# Patient Record
Sex: Female | Born: 1980 | Race: Black or African American | Hispanic: No | Marital: Married | Smoking: Never smoker
Health system: Southern US, Community
[De-identification: ages and names within clinical notes are randomized; demographics above are authoritative.]

## PROBLEM LIST (undated history)

## (undated) DIAGNOSIS — J302 Other seasonal allergic rhinitis: Secondary | ICD-10-CM

## (undated) DIAGNOSIS — F419 Anxiety disorder, unspecified: Secondary | ICD-10-CM

## (undated) HISTORY — PX: LAPAROSCOPIC GASTRIC BAND REMOVAL WITH LAPAROSCOPIC GASTRIC SLEEVE RESECTION: SHX6498

---

## 2007-09-12 ENCOUNTER — Ambulatory Visit: Payer: Self-pay | Admitting: Internal Medicine

## 2011-09-04 ENCOUNTER — Ambulatory Visit: Payer: Self-pay | Admitting: Specialist

## 2011-09-04 LAB — MAGNESIUM: Magnesium: 1.6 mg/dL — ABNORMAL LOW

## 2011-09-04 LAB — COMPREHENSIVE METABOLIC PANEL
Albumin: 3.3 g/dL — ABNORMAL LOW (ref 3.4–5.0)
Alkaline Phosphatase: 76 U/L (ref 50–136)
BUN: 9 mg/dL (ref 7–18)
Bilirubin,Total: 0.5 mg/dL (ref 0.2–1.0)
Chloride: 109 mmol/L — ABNORMAL HIGH (ref 98–107)
Co2: 23 mmol/L (ref 21–32)
Creatinine: 0.79 mg/dL (ref 0.60–1.30)
EGFR (African American): >60
EGFR (Non-African Amer.): >60
Glucose: 111 mg/dL — ABNORMAL HIGH (ref 65–99)
Osmolality: 279 (ref 275–301)
SGPT (ALT): 20 U/L (ref 12–78)
Sodium: 140 mmol/L (ref 136–145)
Total Protein: 7.6 g/dL (ref 6.4–8.2)

## 2011-09-04 LAB — HEMOGLOBIN A1C: Hemoglobin A1C: 6.8 % — ABNORMAL HIGH (ref 4.2–6.3)

## 2011-09-04 LAB — CBC WITH DIFFERENTIAL/PLATELET
Basophil %: 0.8 %
Eosinophil %: 1.3 %
HCT: 40.8 % (ref 35.0–47.0)
Lymphocyte #: 1.7 10*3/uL (ref 1.0–3.6)
Lymphocyte %: 27.7 %
MCH: 31.8 pg (ref 26.0–34.0)
Monocyte #: 0.5 x10 3/mm (ref 0.2–0.9)
Neutrophil #: 3.9 10*3/uL (ref 1.4–6.5)
Platelet: 400 10*3/uL (ref 150–440)
RDW: 13.6 % (ref 11.5–14.5)

## 2011-09-04 LAB — APTT: Activated PTT: 27.3 secs (ref 23.6–35.9)

## 2011-09-04 LAB — HCG, QUANTITATIVE, PREGNANCY: Beta Hcg, Quant.: <1 m[IU]/mL — ABNORMAL LOW

## 2011-09-04 LAB — IRON AND TIBC
Iron Bind.Cap.(Total): 346 ug/dL (ref 250–450)
Iron: 86 ug/dL (ref 50–170)
Unbound Iron-Bind.Cap.: 260 ug/dL

## 2011-09-04 LAB — FOLATE: Folic Acid: 13.9 ng/mL (ref 3.1–100.0)

## 2011-09-04 LAB — HEPATIC FUNCTION PANEL A (ARMC): Bilirubin, Direct: <0.1 mg/dL (ref 0.00–0.20)

## 2011-09-04 LAB — AMYLASE: Amylase: 46 U/L (ref 25–115)

## 2011-09-04 LAB — FERRITIN: Ferritin (ARMC): 93 ng/mL (ref 8–388)

## 2011-09-04 LAB — LIPASE, BLOOD: Lipase: 108 U/L (ref 73–393)

## 2011-09-04 LAB — PHOSPHORUS: Phosphorus: 1.5 mg/dL — ABNORMAL LOW (ref 2.5–4.9)

## 2011-09-04 LAB — CALCIUM: Calcium, Total: 8.4 mg/dL — ABNORMAL LOW (ref 8.5–10.1)

## 2011-09-04 LAB — TSH: Thyroid Stimulating Horm: 1.82 u[IU]/mL

## 2011-09-04 LAB — PROTIME-INR: Prothrombin Time: 12.5 secs (ref 11.5–14.7)

## 2011-09-06 ENCOUNTER — Ambulatory Visit: Payer: Self-pay | Admitting: Specialist

## 2011-09-07 ENCOUNTER — Ambulatory Visit: Payer: Self-pay | Admitting: Specialist

## 2011-09-07 DIAGNOSIS — Z01810 Encounter for preprocedural cardiovascular examination: Secondary | ICD-10-CM

## 2011-09-23 ENCOUNTER — Ambulatory Visit: Payer: Self-pay | Admitting: Specialist

## 2011-12-11 ENCOUNTER — Ambulatory Visit: Payer: Self-pay | Admitting: Specialist

## 2011-12-11 LAB — MAGNESIUM: Magnesium: 1.6 mg/dL — ABNORMAL LOW

## 2011-12-11 LAB — PHOSPHORUS: Phosphorus: 2.1 mg/dL — ABNORMAL LOW (ref 2.5–4.9)

## 2011-12-17 ENCOUNTER — Inpatient Hospital Stay: Payer: Self-pay | Admitting: Specialist

## 2011-12-18 LAB — BASIC METABOLIC PANEL
BUN: 3 mg/dL — ABNORMAL LOW (ref 7–18)
Calcium, Total: 8.2 mg/dL — ABNORMAL LOW (ref 8.5–10.1)
Chloride: 106 mmol/L (ref 98–107)
Co2: 19 mmol/L — ABNORMAL LOW (ref 21–32)
EGFR (African American): >60
EGFR (Non-African Amer.): >60
Potassium: 4.1 mmol/L (ref 3.5–5.1)
Sodium: 135 mmol/L — ABNORMAL LOW (ref 136–145)

## 2011-12-18 LAB — CBC WITH DIFFERENTIAL/PLATELET
Basophil #: 0.1 10*3/uL (ref 0.0–0.1)
Eosinophil #: 0 10*3/uL (ref 0.0–0.7)
HCT: 40.2 % (ref 35.0–47.0)
HGB: 13.7 g/dL (ref 12.0–16.0)
Lymphocyte #: 0.8 10*3/uL — ABNORMAL LOW (ref 1.0–3.6)
Lymphocyte %: 6 %
MCHC: 34 g/dL (ref 32.0–36.0)
Monocyte %: 3.1 %
Neutrophil #: 11.4 10*3/uL — ABNORMAL HIGH (ref 1.4–6.5)
Platelet: 435 10*3/uL (ref 150–440)
RDW: 13.6 % (ref 11.5–14.5)
WBC: 12.6 10*3/uL — ABNORMAL HIGH (ref 3.6–11.0)

## 2011-12-18 LAB — ALBUMIN: Albumin: 3.3 g/dL — ABNORMAL LOW (ref 3.4–5.0)

## 2011-12-18 LAB — MAGNESIUM: Magnesium: 1.6 mg/dL — ABNORMAL LOW

## 2011-12-19 LAB — PATHOLOGY REPORT

## 2012-01-11 ENCOUNTER — Ambulatory Visit: Payer: Self-pay | Admitting: Specialist

## 2012-01-23 ENCOUNTER — Ambulatory Visit: Payer: Self-pay | Admitting: Specialist

## 2014-05-11 NOTE — Op Note (Signed)
PATIENT NAME:  Anna Carey, [name] MR#:  [id number] DATE OF BIRTH:  [birth year]  DATE OF PROCEDURE:  12/17/2011  PREOPERATIVE DIAGNOSIS: Morbid obesity.  POSTOPERATIVE DIAGNOSIS:  1. Morbid obesity. 2. Hiatal hernia.    PROCEDURE PERFORMED: Laparoscopic sleeve gastrectomy with laparoscopic hiatal hernia repair.   SURGEON: Primus BravoJon Bruce, MD  ASSISTANT: Mariella SaaSarah Stout, PA  ANESTHESIA: General endotracheal.   CLINICAL HISTORY: See History and Physical.  COMPLICATIONS: None.   ESTIMATED BLOOD LOSS: None.   FINDINGS: Moderate hiatal hernia.   PREOPERATIVE DIAGNOSIS: Morbid obesity.  POSTOPERATIVE DIAGNOSIS: Morbid obesity.  PROCEDURE:  Sleeve gastrectomy.  SURGEON: Primus BravoJon Bruce, MD  ASSISTANT:  Tobe SosWill Hawkins, PA  ANESTHESIA:  General endotracheal. INDICATION:  See History and Physical.   COMPLICATIONS: None.  ESTIMATED BLOOD LOSS: None.  DETAILS OF PROCEDURE:  The patient was taken to the operating room and placed on the operating room table, in the supine position, with appropriate monitors and supplemental oxygen being delivered.  Broad spectrum IV antibiotics were administered. The patient was placed under general anesthesia without incident.  The abdomen was prepped and draped in the usual sterile fashion.  Access was obtained using 5 mm Optical trocar. Pneumoperitoneum was established without difficulty. Multiple other ports were placed in preparation for sleeve gastrectomy. A liver retractor was placed without incident. The entire stomach was mobilized from 5 cm from the pylorus all the way up to the fundus, and the fundus was mobilized off the left crura as well completely freeing up the posterior portion of the stomach. Posterior attachments were taken down so the crura could be visualized from both sides.  At that point, everything was removed from the stomach and a 234 [place] Bougie was placed down into the antrum. An Echelon green load stapler was used to bisect the antrum on first  fire starting approximately 5 to 6 cm from the pylorus. I then continued up along the Bougie using a blue load stapler with excellent affect with care not to get too close to the Bougie itself, with minimal traction. This continued all the way up to the left crura. The excess stomach was placed on the side and the Bougie was removed and endoscopy showed no evidence of obstruction at that time.  The excess stomach was removed through the abdominal cavity, and the wounds were closed using 4-0 Vicryl and Dermabond.   Addendum: The hiatal hernia was closed in posterior fashion with mobilization of the posterior crura away from the esophagus and posterior vagus nerve while 1.5 cm of repair were performed posteriorly with interrupted 0 Surgidac sutures.   ____________________________ Primus BravoJon Bruce, MD jb:cbb D: 12/17/2011 15:53:42 ET T: 12/18/2011 09:35:31 ET JOB#: [id number]  cc: Primus BravoJon Bruce, MD, <Dictator> Geoffry ParadiseJON M BRUCE MD ELECTRONICALLY SIGNED 12/18/2011 10:57

## 2014-07-14 ENCOUNTER — Encounter: Payer: Self-pay | Admitting: Podiatry

## 2014-07-14 ENCOUNTER — Ambulatory Visit (INDEPENDENT_AMBULATORY_CARE_PROVIDER_SITE_OTHER): Payer: 59 | Admitting: Podiatry

## 2014-07-14 ENCOUNTER — Ambulatory Visit (INDEPENDENT_AMBULATORY_CARE_PROVIDER_SITE_OTHER): Payer: 59

## 2014-07-14 VITALS — BP 117/81 | HR 81 | Resp 16 | Ht 63.0 in | Wt 274.0 lb

## 2014-07-14 DIAGNOSIS — Q6650 Congenital pes planus, unspecified foot: Secondary | ICD-10-CM

## 2014-07-14 DIAGNOSIS — M722 Plantar fascial fibromatosis: Secondary | ICD-10-CM | POA: Diagnosis not present

## 2014-07-14 DIAGNOSIS — M79672 Pain in left foot: Secondary | ICD-10-CM | POA: Diagnosis not present

## 2014-07-14 NOTE — Progress Notes (Signed)
   Subjective:    Patient ID: Dayna Ramus, female    DOB: 04-Jan-1981, 34 y.o.   MRN: [id number] Pt has had foot orthotics in the pasted and wore them out really fast and would like a new pair . She has back, knee, and hip trouble and she sees a difference when she wears the insoles.  HPI    Review of Systems  Musculoskeletal: Positive for gait problem.       Joint pain  Back pain  muscle pain   Allergic/Immunologic: Positive for environmental allergies and food allergies.       Objective:   Physical Exam: I have reviewed her past mental history medications allergies surgery social history and review of systems. Pulses are strongly palpable bilateral. Neurologic sensorium is intact per Semmes-Weinstein monofilament. Deep tendon reflexes are intact bilateral and muscle strength +5 over 5 dorsiflexion and plantar flexors and inverters everters all intrinsic musculature is intact. Orthopedic evaluation was resolved joints distal to the ankle for range of motion without crepitation. She has severe has planus bilateral with pain on palpation medial calcaneal tubercles bilaterally. She states that she controls the majority of this with shoe gear. She is trying to lose weight and is trying to be more active.        Assessment & Plan:  Assessment: Plantar fasciitis and pes planus bilateral.  Plan: I encouraged her to continue her exercise regimen discussed appropriate shoe gear stretching exercises ice therapy and shoe gear modifications. Also suggested a new pair orthotics which were scanned for today and I will follow-up with her once those come in.

## 2014-07-19 ENCOUNTER — Telehealth: Payer: Self-pay

## 2014-07-19 NOTE — Telephone Encounter (Signed)
Called pt let her know that her orthotics were in and she needs to call to make an appt ( nurse schedule [facility]) to come in and pick up.

## 2014-07-27 ENCOUNTER — Other Ambulatory Visit: Payer: 59

## 2014-07-28 ENCOUNTER — Ambulatory Visit (INDEPENDENT_AMBULATORY_CARE_PROVIDER_SITE_OTHER): Payer: 59 | Admitting: *Deleted

## 2014-07-28 DIAGNOSIS — M722 Plantar fascial fibromatosis: Secondary | ICD-10-CM

## 2014-07-28 NOTE — Patient Instructions (Signed)

## 2014-07-28 NOTE — Progress Notes (Signed)
Orthotics dispensed.Given instructions on breakin. Recheck in 1 month, if needed.

## 2014-08-30 ENCOUNTER — Ambulatory Visit: Payer: 59 | Admitting: Podiatry

## 2019-05-17 ENCOUNTER — Encounter: Payer: Self-pay | Admitting: Emergency Medicine

## 2019-05-17 ENCOUNTER — Ambulatory Visit
Admission: EM | Admit: 2019-05-17 | Discharge: 2019-05-17 | Disposition: A | Discharge status: Home / Self Care | Payer: BC Managed Care – PPO | Attending: Family Medicine | Admitting: Family Medicine

## 2019-05-17 ENCOUNTER — Other Ambulatory Visit: Payer: Self-pay

## 2019-05-17 DIAGNOSIS — Z823 Family history of stroke: Secondary | ICD-10-CM | POA: Diagnosis not present

## 2019-05-17 DIAGNOSIS — J069 Acute upper respiratory infection, unspecified: Secondary | ICD-10-CM | POA: Diagnosis not present

## 2019-05-17 DIAGNOSIS — K529 Noninfective gastroenteritis and colitis, unspecified: Secondary | ICD-10-CM | POA: Diagnosis not present

## 2019-05-17 DIAGNOSIS — Z79899 Other long term (current) drug therapy: Secondary | ICD-10-CM | POA: Insufficient documentation

## 2019-05-17 DIAGNOSIS — F419 Anxiety disorder, unspecified: Secondary | ICD-10-CM | POA: Insufficient documentation

## 2019-05-17 DIAGNOSIS — U071 COVID-19: Secondary | ICD-10-CM | POA: Insufficient documentation

## 2019-05-17 DIAGNOSIS — R1110 Vomiting, unspecified: Secondary | ICD-10-CM | POA: Diagnosis present

## 2019-05-17 HISTORY — DX: Anxiety disorder, unspecified: F41.9

## 2019-05-17 HISTORY — DX: Other seasonal allergic rhinitis: J30.2

## 2019-05-17 MED ORDER — ONDANSETRON 8 MG PO TBDP: 8.0000 mg | ORAL_TABLET | Freq: Three times a day (TID) | ORAL | 0 refills | Status: AC | PRN

## 2019-05-17 NOTE — ED Provider Notes (Signed)
[facility]    CSN: [id number] Arrival date & time: 05/17/19  1350      History   Chief Complaint Chief Complaint  Patient presents with  . Emesis    HPI Anna Carey is a 39 y.o. female.   38 yo female with a c/o cold symptoms of nasal congestion, runny nose, mild cough for the past week and intermittent vomiting and diarrhea for the past 3 days. Denies any chest pains, shortness of breath, wheezing, chills, dysuria, hematuria, abdominal pain. 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    Emesis   Past Medical History:  Diagnosis Date  . Anxiety   . Seasonal allergies     There are no problems to display for this patient.   Past Surgical History:  Procedure Laterality Date  . LAPAROSCOPIC GASTRIC BAND REMOVAL WITH LAPAROSCOPIC GASTRIC SLEEVE RESECTION      OB History   No obstetric history on file.      Home Medications    Prior to Admission medications   Medication Sig Start Date End Date Taking? Authorizing Provider  cetirizine (ZYRTEC) 10 MG tablet Take by mouth. 09/09/07  Yes [provider]  fluticasone (FLONASE) 50 MCG/ACT nasal spray 1 spray by Each Nare route once daily. 06/05/12  Yes [provider]  norethindrone-ethinyl estradiol (ORTHO-NOVUM 7/7/7, 28,) 0.5/0.75/1-35 MG-MCG tablet TAKE 1 TABLET BY MOUTH EVERY DAY CONTINUOUSLY 06/18/14  Yes [provider]  sertraline (ZOLOFT) 100 MG tablet  04/19/14  Yes [provider]  ondansetron (ZOFRAN ODT) 8 MG disintegrating tablet Take 1 tablet (8 mg total) by mouth every 8 (eight) hours as needed. 05/17/19   Payton Mccallum, MD    Family History Family History  Problem Relation Age of Onset  . Stroke Mother   . Healthy Father     Social History Social History   Tobacco Use  . Smoking status: Never Smoker  . Smokeless tobacco: Never Used  Substance Use Topics  . Alcohol use: Not Currently    Alcohol/week: 0.0 standard drinks  . Drug use: Never      Allergies   Patient has no known allergies.   Review of Systems Review of Systems  Gastrointestinal: Positive for vomiting.     Physical Exam Triage Vital Signs ED Triage Vitals  Enc Vitals Group     BP 05/17/19 1406 113/84     Pulse Rate 05/17/19 1406 (!) 106     Resp 05/17/19 1406 16     Temp 05/17/19 1406 99.2 F (37.3 C)     Temp Source 05/17/19 1406 Oral     SpO2 05/17/19 1406 100 %     Weight 05/17/19 1402 280 lb (127 kg)     Height 05/17/19 1402 5\' 3"  (1.6 m)     Head Circumference --      Peak Flow --      Pain Score 05/17/19 1402 3     Pain Loc --      Pain Edu? --      Excl. in GC? --    No data found.  Updated Vital Signs BP 113/84 (BP Location: Right Arm)   Pulse (!) 106   Temp 99.2 F (37.3 C) (Oral)   Resp 16   Ht 5\' 3"  (1.6 m)   Wt 127 kg   LMP 05/03/2019 (Approximate)   SpO2 100%   BMI 49.60 kg/m   Visual Acuity Right Eye Distance:   Left Eye Distance:   Bilateral Distance:  Right Eye Near:   Left Eye Near:    Bilateral Near:     Physical Exam Vitals and nursing note reviewed.  Constitutional:      General: She is not in acute distress.    Appearance: She is not toxic-appearing or diaphoretic.  HENT:     Right Ear: Tympanic membrane normal.     Left Ear: Tympanic membrane normal.     Nose: Congestion present.  Cardiovascular:     Heart sounds: Normal heart sounds.  Pulmonary:     Effort: Pulmonary effort is normal. No respiratory distress.     Breath sounds: Normal breath sounds.  Abdominal:     General: Bowel sounds are normal. There is no distension.     Palpations: Abdomen is soft. There is no mass.     Tenderness: There is no abdominal tenderness. There is no right CVA tenderness, left CVA tenderness, guarding or rebound.     Hernia: No hernia is present.  Skin:    Findings: No rash.  Neurological:     Mental Status: She is alert.      UC Treatments / Results  Labs (all labs ordered are listed, but only  abnormal results are displayed) Labs Reviewed  SARS CORONAVIRUS 2 (TAT 6-24 HRS)    EKG   Radiology No results found.  Procedures Procedures (including critical care time)  Medications Ordered in UC Medications - No data to display  Initial Impression / Assessment and Plan / UC Course  I have reviewed the triage vital signs and the nursing notes.  Pertinent labs & imaging results that were available during my care of the patient were reviewed by me and considered in my medical decision making (see chart for details).      Final Clinical Impressions(s) / UC Diagnoses   Final diagnoses:  Gastroenteritis  Viral URI     Discharge Instructions     Rest, fluids, await covid test Go to Emergency Department if symptoms get worse or are not improving    ED Prescriptions    Medication Sig Dispense Auth. Provider   ondansetron (ZOFRAN ODT) 8 MG disintegrating tablet Take 1 tablet (8 mg total) by mouth every 8 (eight) hours as needed. 6 tablet Norval Gable, MD      1. diagnosis reviewed with patient 2. rx as per orders above; reviewed possible side effects, interactions, risks and benefits  3. Recommend supportive treatment with rest, fluids 4. covid test done 5. Follow-up prn if symptoms worsen or don't improve  PDMP not reviewed this encounter.   Norval Gable, MD 05/17/19 [phone]

## 2019-05-17 NOTE — Discharge Instructions (Signed)
Rest, fluids, await covid test Go to Emergency Department if symptoms get worse or are not improving

## 2019-05-17 NOTE — ED Triage Notes (Signed)
Patient states that she has had cold symptoms a week ago.  Patient states that these symptoms started to resolve.  Patient states that last Friday she had upset stomach.  Patient states that today she vomited and then felt light headed.  Patient reports low grad fevers 3 days ago.

## 2019-05-18 ENCOUNTER — Telehealth: Payer: Self-pay | Admitting: *Deleted

## 2019-05-18 LAB — SARS CORONAVIRUS 2 (TAT 6-24 HRS): SARS Coronavirus 2: POSITIVE — AB

## 2019-05-18 NOTE — Telephone Encounter (Signed)
Patient is concerned that no one has called her regarding her COVID result- she saw it in MyChart-. Explained to patient that the provider may not have signed off on the  Result yet for the staff to contact her. Results for COVID are released without a hold for review so patienst can be aware as soon as possible. Advised:  Patient notified of + COVID result. Patient test due to symptoms: GI symptoms, nausea. Patient advised to treat symptoms as needed OTC, contact PCP for follow up and go to ED for trouble breathing ,dehydration or severe weakness. Advised to isolate and safe precautions in the home reviewed. CDC criteria for ending isolation given. Health dept notified.

## 2019-05-19 ENCOUNTER — Telehealth: Payer: Self-pay | Admitting: Unknown Physician Specialty

## 2019-05-19 NOTE — Telephone Encounter (Signed)
Called to discuss with Anna Carey about Covid symptoms and the use of bamlanivimab, a monoclonal antibody infusion for those with mild to moderate Covid symptoms and at a high risk of hospitalization.     Pt does not qualify for infusion therapy as her symptoms first presented > 10 days prior to timing of infusion. Symptoms tier reviewed as well as criteria for ending isolation. Preventative practices reviewed. Patient verbalized understanding   There are no problems to display for this patient.

## 2021-04-05 ENCOUNTER — Other Ambulatory Visit: Payer: Self-pay | Admitting: Internal Medicine

## 2021-04-05 DIAGNOSIS — Z1231 Encounter for screening mammogram for malignant neoplasm of breast: Secondary | ICD-10-CM

## 2021-05-18 ENCOUNTER — Ambulatory Visit
Admission: RE | Admit: 2021-05-18 | Discharge: 2021-05-18 | Disposition: A | Discharge status: Home / Self Care | Payer: BC Managed Care – PPO | Source: Ambulatory Visit | Attending: Internal Medicine | Admitting: Internal Medicine

## 2021-05-18 DIAGNOSIS — Z1231 Encounter for screening mammogram for malignant neoplasm of breast: Secondary | ICD-10-CM | POA: Insufficient documentation

## 2022-07-11 ENCOUNTER — Other Ambulatory Visit: Payer: Self-pay | Admitting: Internal Medicine

## 2022-07-11 DIAGNOSIS — Z1231 Encounter for screening mammogram for malignant neoplasm of breast: Secondary | ICD-10-CM

## 2022-08-06 ENCOUNTER — Ambulatory Visit
Admission: RE | Admit: 2022-08-06 | Discharge: 2022-08-06 | Disposition: A | Discharge status: Home / Self Care | Payer: BC Managed Care – PPO | Source: Ambulatory Visit | Attending: Internal Medicine | Admitting: Internal Medicine

## 2022-08-06 DIAGNOSIS — Z1231 Encounter for screening mammogram for malignant neoplasm of breast: Secondary | ICD-10-CM | POA: Insufficient documentation

## 2022-08-08 ENCOUNTER — Encounter: Payer: Self-pay | Admitting: Internal Medicine

## 2022-08-08 DIAGNOSIS — Z1231 Encounter for screening mammogram for malignant neoplasm of breast: Secondary | ICD-10-CM

## 2022-08-09 ENCOUNTER — Other Ambulatory Visit: Payer: Self-pay | Admitting: Internal Medicine

## 2022-08-09 DIAGNOSIS — N6489 Other specified disorders of breast: Secondary | ICD-10-CM

## 2022-08-09 DIAGNOSIS — R928 Other abnormal and inconclusive findings on diagnostic imaging of breast: Secondary | ICD-10-CM

## 2022-08-15 ENCOUNTER — Ambulatory Visit
Admission: RE | Admit: 2022-08-15 | Discharge: 2022-08-15 | Disposition: A | Discharge status: Home / Self Care | Payer: BC Managed Care – PPO | Source: Ambulatory Visit | Attending: Internal Medicine | Admitting: Internal Medicine

## 2022-08-15 ENCOUNTER — Ambulatory Visit: Admission: RE | Admit: 2022-08-15 | Payer: BC Managed Care – PPO | Source: Ambulatory Visit

## 2022-08-15 DIAGNOSIS — N6489 Other specified disorders of breast: Secondary | ICD-10-CM

## 2022-08-15 DIAGNOSIS — R928 Other abnormal and inconclusive findings on diagnostic imaging of breast: Secondary | ICD-10-CM | POA: Diagnosis present

## 2022-08-18 IMAGING — MG MM DIGITAL SCREENING BILAT W/ TOMO AND CAD
8 series · 8 of 24 positions shown · non-contrast
Comparison: None.

CLINICAL DATA: Screening. This is the patient's initial baseline
mammogram.

EXAM:
DIGITAL SCREENING BILATERAL MAMMOGRAM WITH TOMOSYNTHESIS AND CAD
TECHNIQUE: Bilateral screening digital craniocaudal and mediolateral oblique
mammograms were obtained. Bilateral screening digital breast
tomosynthesis was performed. The images were evaluated with
computer-aided detection.

[R MLO synth-2D]
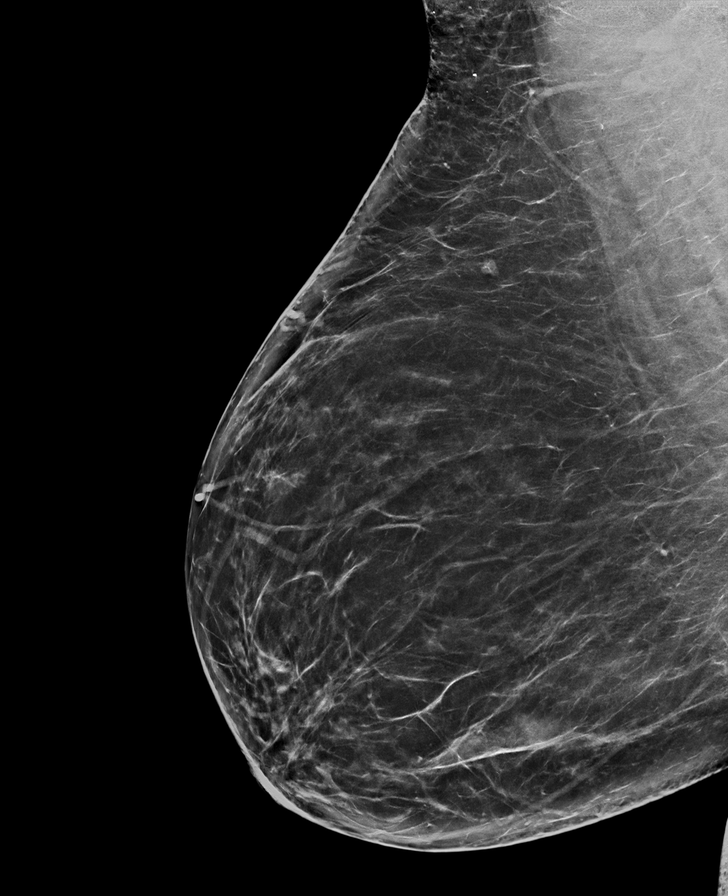

[R CC synth-2D]
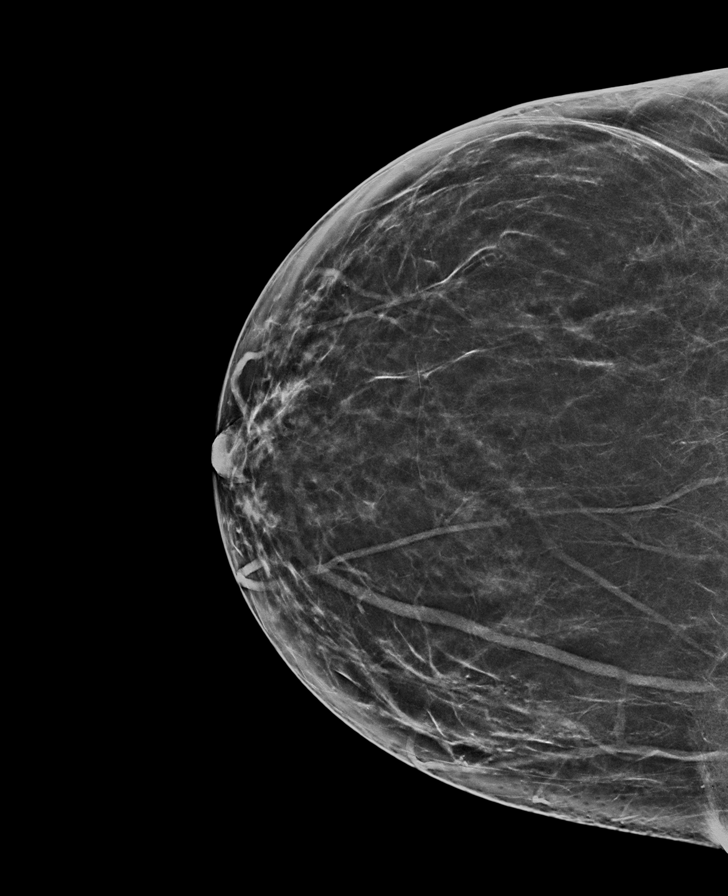

[L MLO synth-2D]
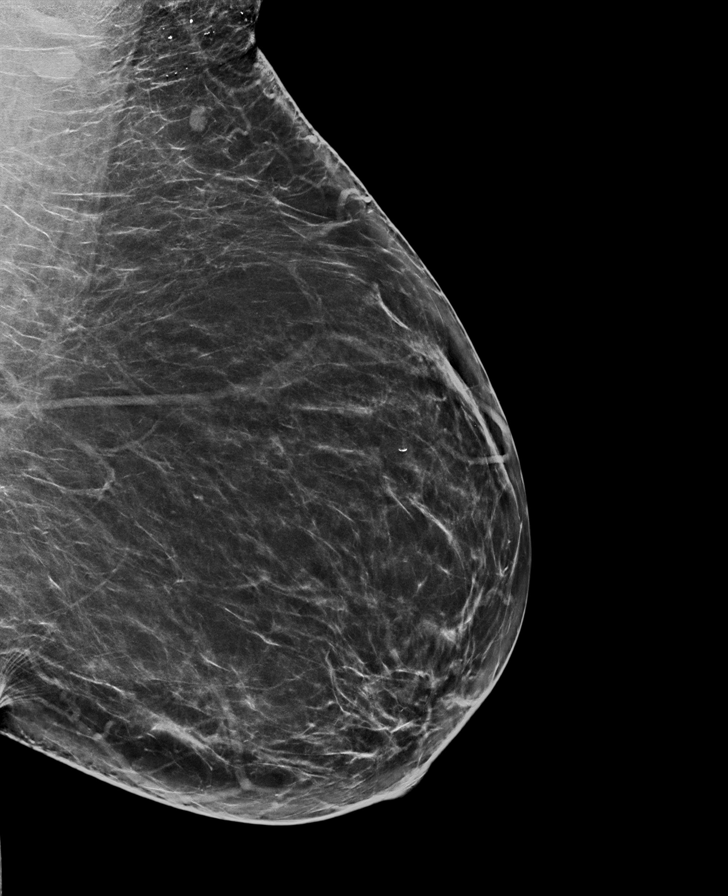

[L CC synth-2D]
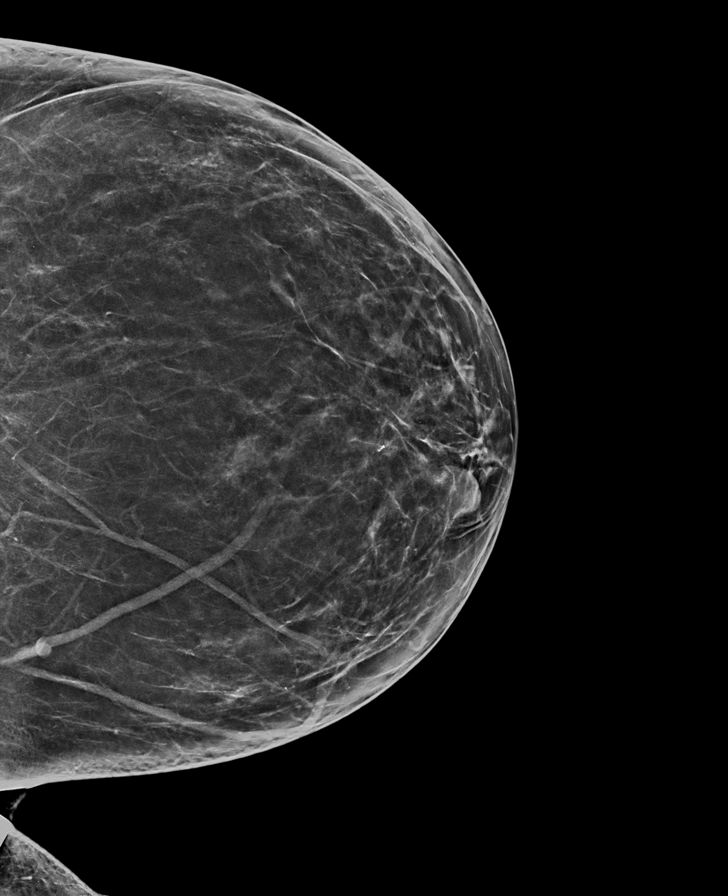

[R MLO tomo · tomo slice 45/88.0]
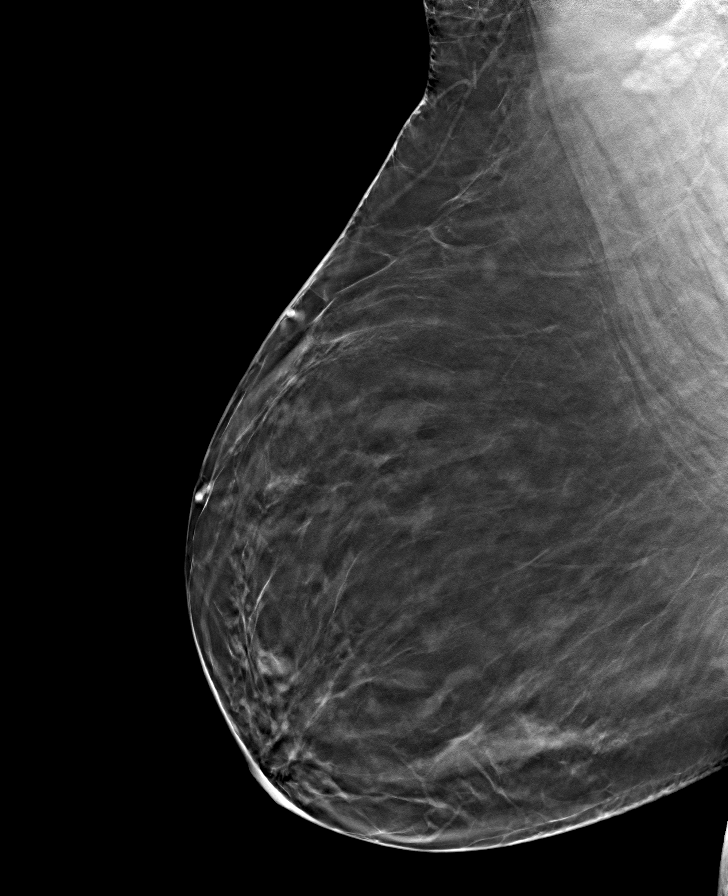

[R CC tomo · tomo slice 37/72.0]
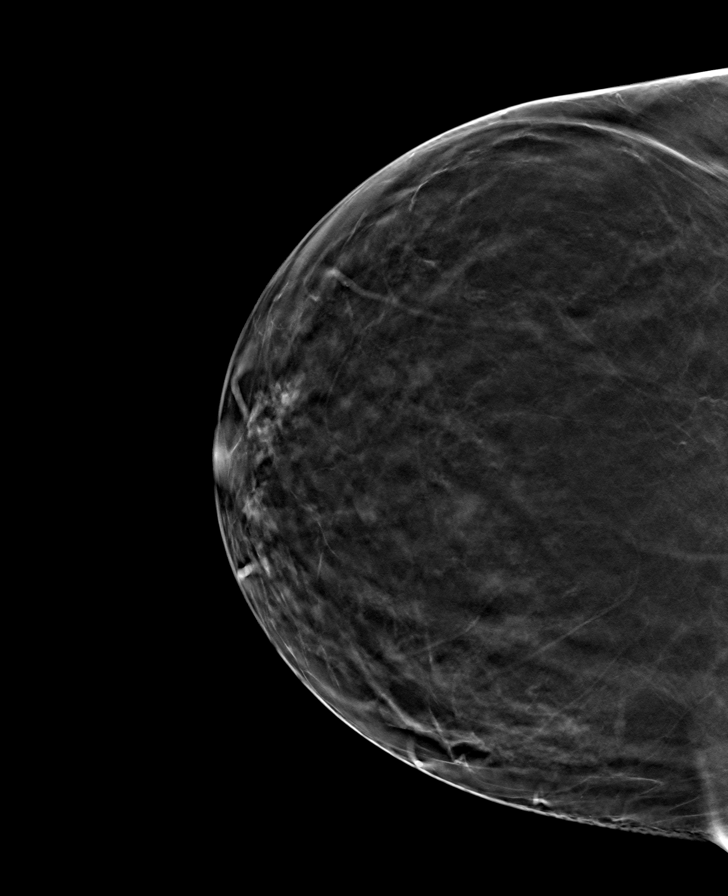

[L MLO tomo · tomo slice 43/84.0]
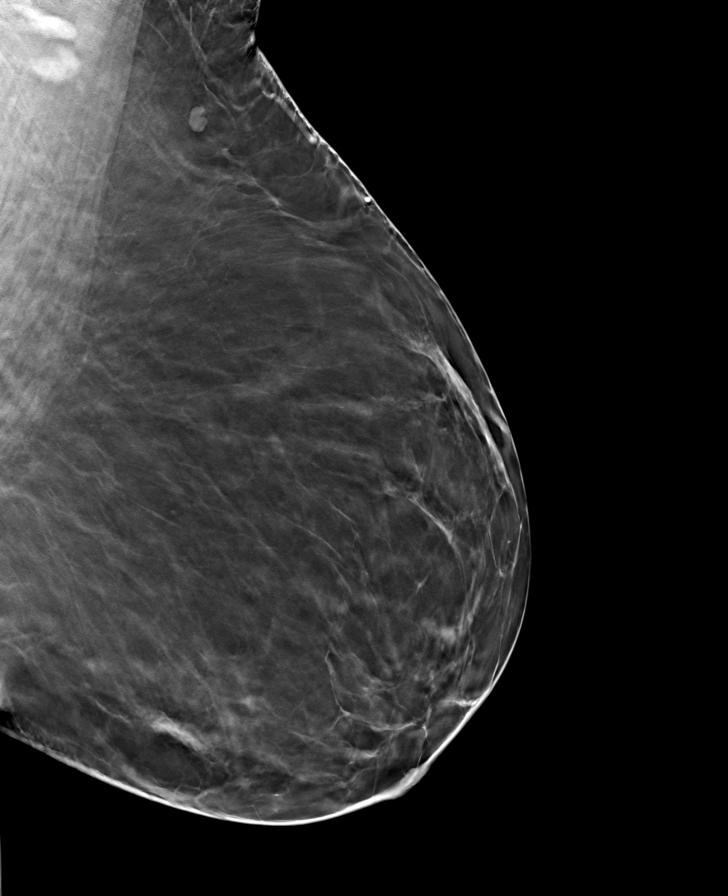

[L CC tomo · tomo slice 38/75.0]
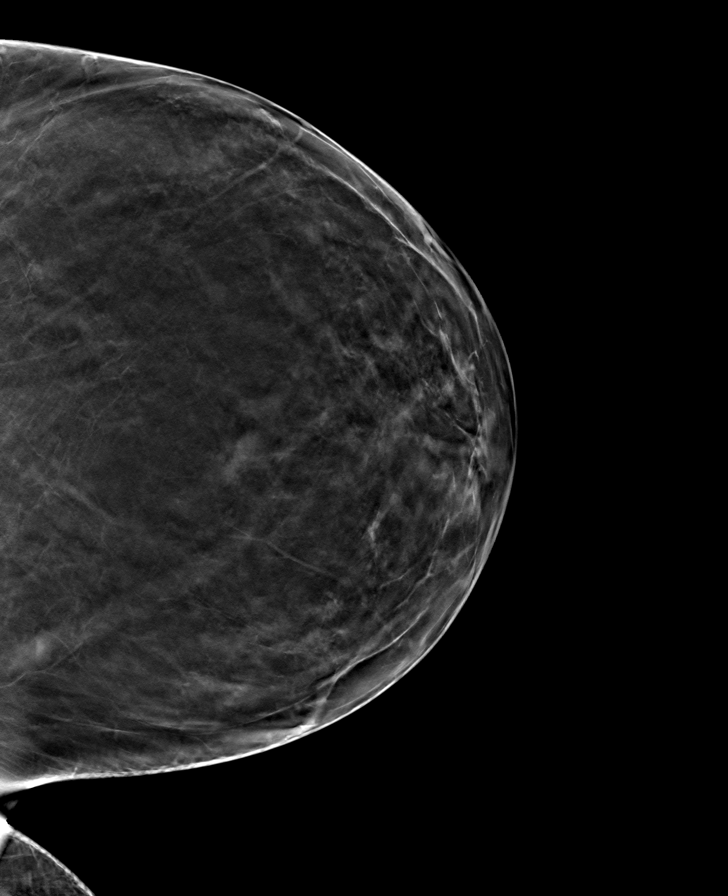

[8 of 24 positions shown; findings below may reference images not displayed]

ACR Breast Density Category b: There are scattered areas of
fibroglandular density.
FINDINGS: There are no findings suspicious for malignancy.
IMPRESSION: No mammographic evidence of malignancy. A result letter of this
screening mammogram will be mailed directly to the patient.

RECOMMENDATION:
Screening mammogram in one year. (Code:91-A-9VU)

BI-RADS CATEGORY  1: Negative.

## 2022-08-20 ENCOUNTER — Other Ambulatory Visit: Payer: BC Managed Care – PPO

## 2023-05-09 ENCOUNTER — Other Ambulatory Visit: Payer: Self-pay | Admitting: Internal Medicine

## 2023-05-09 DIAGNOSIS — Z1231 Encounter for screening mammogram for malignant neoplasm of breast: Secondary | ICD-10-CM

## 2023-08-13 ENCOUNTER — Ambulatory Visit
Admission: RE | Admit: 2023-08-13 | Discharge: 2023-08-13 | Disposition: A | Discharge status: Home / Self Care | Payer: Self-pay | Source: Ambulatory Visit | Attending: Internal Medicine | Admitting: Internal Medicine

## 2023-08-13 DIAGNOSIS — Z1231 Encounter for screening mammogram for malignant neoplasm of breast: Secondary | ICD-10-CM | POA: Diagnosis present
